# Patient Record
Sex: Male | Born: 1990 | Race: White | Hispanic: No | Marital: Married | Smoking: Never smoker
Health system: Southern US, Community
[De-identification: ages and names within clinical notes are randomized; demographics above are authoritative.]

---
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
## 2011-09-19 ENCOUNTER — Ambulatory Visit (INDEPENDENT_AMBULATORY_CARE_PROVIDER_SITE_OTHER): Payer: PRIVATE HEALTH INSURANCE | Admitting: Family Medicine

## 2011-09-19 VITALS — BP 132/73 | HR 87 | Temp 98.0°F | Resp 16 | Ht 66.38 in | Wt 146.8 lb

## 2011-09-19 DIAGNOSIS — M79609 Pain in unspecified limb: Secondary | ICD-10-CM

## 2011-09-19 DIAGNOSIS — M79676 Pain in unspecified toe(s): Secondary | ICD-10-CM

## 2011-09-19 DIAGNOSIS — L600 Ingrowing nail: Secondary | ICD-10-CM

## 2011-09-19 NOTE — Patient Instructions (Signed)
INGROWN TOENAIL . Keep area clean, dry and bandaged for 24 hours. . After 24 hours, remove outer bandage and leave yellow gauze in place. . Soak toe/foot in warm soapy water for 5-10 minutes, once daily for 5 days. Rebandage toe after each cleaning. . Continue soaks until yellow gauze falls off. . Notify the office if you experience any of the following signs of infection: Swelling, redness, pus drainage, streaking, fever > 101.0 F   

## 2011-09-19 NOTE — Progress Notes (Signed)
VCO.  Digital block with 4 cc 2% lidocaine plain. Sterile Prep and Drape. Medial nail lifted and removed.  Xeroform placed.  Cleansed and dressed.

## 2011-09-19 NOTE — Progress Notes (Signed)
    Patient Name: Tom Tran Date of Birth: January 09, 1991 Medical Record Number: [id number] Gender: male Date of Encounter: 09/19/2011  History of Present Illness:  Tom Tran is a 21 y.o. very pleasant male patient who presents with the following:  Here to evaluate an ingrown left great toenail which has been painful for about 2 days.  It is red and swollen.  He had the same problem (in the same nail this time last year).  He had no injury to the nail. He is generally healthy  There is no problem list on file for this patient.  No past medical history on file. No past surgical history on file. History  Substance Use Topics  . Smoking status: Never Smoker   . Smokeless tobacco: Not on file  . Alcohol Use: Not on file   No family history on file. Allergies  Allergen Reactions  . Cefzil (Cefprozil) Anaphylaxis  . Penicillins Anaphylaxis  . Vantin (Cefpodoxime Proxetil) Anaphylaxis    Medication list has been reviewed and updated.  Prior to Admission medications   Medication Sig Start Date End Date Taking? Authorizing Provider  amphetamine-dextroamphetamine (ADDERALL XR) 30 MG 24 hr capsule Take 30 mg by mouth every morning.   Yes Historical Provider, MD    Review of Systems:  As per HPI- otherwise negative.   Physical Examination: Filed Vitals:   09/19/11 0924  BP: 132/73  Pulse: 87  Temp: 98 F (36.7 C)  Resp: 16   Filed Vitals:   09/19/11 0924  Height: 5' 6.38" (1.686 m)  Weight: 146 lb 12.8 oz (66.588 kg)   Body mass index is 23.42 kg/(m^2). Ideal Body Weight: Weight in (lb) to have BMI = 25: 156.4    GEN: WDWN, NAD, Non-toxic, Alert & Oriented x 3 HEENT: Atraumatic, Normocephalic.  Ears and Nose: No external deformity. EXTR: No clubbing/cyanosis/edema NEURO: Normal gait.  PSYCH: Normally interactive. Conversant. Not depressed or anxious appearing.  Calm demeanor.  Left great toenail- swollen, red and cellulitic at the medial corner of the nail.     Partial nail resection done by Porfirio Oar, PA- C.    Assessment and Plan: 1. Pain in toe   2. Ingrown toenail    Treated as per note by Ms. Leotis Shames.  Wound care gone over, he will follow- up as needed  Cordell Guercio, MD

## 2012-10-04 ENCOUNTER — Ambulatory Visit (INDEPENDENT_AMBULATORY_CARE_PROVIDER_SITE_OTHER): Payer: 59 | Admitting: Physician Assistant

## 2012-10-04 VITALS — BP 134/82 | HR 82 | Temp 98.0°F | Resp 16 | Ht 66.75 in | Wt 146.2 lb

## 2012-10-04 DIAGNOSIS — L600 Ingrowing nail: Secondary | ICD-10-CM

## 2012-10-04 DIAGNOSIS — M79609 Pain in unspecified limb: Secondary | ICD-10-CM

## 2012-10-04 NOTE — Progress Notes (Signed)
  Subjective:    Patient ID: Tom Tran, male    DOB: [birth year], 22 y.o.   MRN: [id number]  HPI   Tom Tran is a very pleasant 22 yr old male here with concern for bilateral ingrown toenails.  States this has been a recurring problem for him, happens almost yearly.  Right is worse than left.  Has tried to take care of it himself without success.  He does not think the left toe nail is ingrown yet, but has just started noticing some swelling.  No drainage from either toe.  No fever or chills.  He otherwise feels well today.     Review of Systems  Skin:       Ingrown toenail  All other systems reviewed and are negative.       Objective:   Physical Exam  Vitals reviewed. Constitutional: He is oriented to person, place, and time. He appears well-developed and well-nourished. No distress.  HENT:  Head: Normocephalic and atraumatic.  Eyes: Conjunctivae are normal. No scleral icterus.  Pulmonary/Chest: Effort normal.  Musculoskeletal:       Feet:  Lateral aspect of right great toe nail ingrown - erythematous and TTP, no drainage, nail is cut very short where pt has tried to dig out himself  Lateral aspect left great toe beginning to ingrow - very slight erythema, swelling; no TTP or drainage; significant callus built up at nail margin  Neurological: He is alert and oriented to person, place, and time.  Skin: Skin is warm and dry.  Psychiatric: He has a normal mood and affect. His behavior is normal.     Procedure:: Verbal consent obtained from the patient.   (1) Gently pared down callused tissue with 15 blade at the lateral aspect of the LEFT great toe nail.  Able to gently lift the corner of the nail up. Placed a steri-strip under corner of the nail, wrapping around the toe to pull tissue away and hopefully prevent the nail from ingrowing. (2) Partial digital block of the RIGHT great toe with 1:1 ratio 2% plain lidocaine with 0.5% marcaine 2cc.  Additional 1cc of local anesthesia to  the nail margin.  Betadine prep.  Sterile technique employed.  Lateral aspect of the great toe nail lifted without difficulty and removed in total.  Proximal aspect of the nail bed explored revealing no nail remnants.  Ingrown tissue debrided and irrigated.  Xeroform dressing applied.  Wound care discussed with patient. Pt tolerated well.      Assessment & Plan:  Ingrown toenail without infection   Tom Tran is a pleasant 22 yr old male with ingrown toenail.  Procedure as documented above.  The area does not appear to be infected, and I do not think he requires antibiotics today.  Discussed RTC precautions such as increased redness, swelling, drainage.  Discussed wound care.  Encouraged pt to cut toenails straight across rather than curved to hopefully prevent ingrowing in the future.

## 2012-10-04 NOTE — Patient Instructions (Addendum)
INGROWN TOENAIL . Keep area clean, dry and bandaged for 24 hours. . After 24 hours, remove outer bandage and leave yellow gauze in place. . Soak toe/foot in warm soapy water for 5-10 minutes, once daily for 5 days. Rebandage toe after each cleaning. . Continue soaks until yellow gauze falls off. . Notify the office if you experience any of the following signs of infection: Swelling, redness, pus drainage, streaking, fever > 101.0 F   

## 2012-10-06 ENCOUNTER — Telehealth: Payer: Self-pay

## 2012-10-06 ENCOUNTER — Encounter: Payer: Self-pay | Admitting: Radiology

## 2012-10-06 ENCOUNTER — Telehealth: Payer: Self-pay | Admitting: Radiology

## 2012-10-06 NOTE — Telephone Encounter (Signed)
Note provided for work for the day he was seen. Called him to advise.

## 2012-10-06 NOTE — Telephone Encounter (Signed)
Pt is calling because he is needing a work note from his visit the other day. His father will be picking up his name is also Tom Tran Call back number is [phone]

## 2012-10-06 NOTE — Telephone Encounter (Signed)
Note provided for 2 days,, then seated work the rest of the week. At front desk for pick up

## 2012-10-06 NOTE — Telephone Encounter (Signed)
Letter provided for patient for his employer.

## 2013-04-05 ENCOUNTER — Ambulatory Visit (INDEPENDENT_AMBULATORY_CARE_PROVIDER_SITE_OTHER): Payer: 59 | Admitting: Internal Medicine

## 2013-04-05 VITALS — BP 144/90 | HR 97 | Temp 98.6°F | Resp 16 | Ht 66.5 in | Wt 147.0 lb

## 2013-04-05 DIAGNOSIS — L03031 Cellulitis of right toe: Principal | ICD-10-CM

## 2013-04-05 DIAGNOSIS — M79609 Pain in unspecified limb: Secondary | ICD-10-CM

## 2013-04-05 DIAGNOSIS — L02619 Cutaneous abscess of unspecified foot: Secondary | ICD-10-CM

## 2013-04-05 DIAGNOSIS — L600 Ingrowing nail: Secondary | ICD-10-CM

## 2013-04-05 DIAGNOSIS — L02611 Cutaneous abscess of right foot: Secondary | ICD-10-CM

## 2013-04-05 DIAGNOSIS — L03039 Cellulitis of unspecified toe: Secondary | ICD-10-CM

## 2013-04-05 MED ORDER — MUPIROCIN 2 % EX OINT: 1.0000 "application " | TOPICAL_OINTMENT | Freq: Three times a day (TID) | CUTANEOUS | Status: AC

## 2013-04-05 MED ORDER — DOXYCYCLINE HYCLATE 100 MG PO TABS: 100.0000 mg | ORAL_TABLET | Freq: Two times a day (BID) | ORAL | Status: AC

## 2013-04-05 NOTE — Progress Notes (Signed)
   Subjective:    Patient ID: Tom Tran, male    DOB: [birth year], 23 y.o.   MRN: [id number]  HPI    Review of Systems     Objective:   Physical Exam        Assessment & Plan:

## 2013-04-05 NOTE — Progress Notes (Signed)
   Subjective:    Patient ID: Tom Tran, male    DOB: November 17, 1990, 23 y.o.   MRN: [id number]  HPI  23 y.o. Male presents to clinic with painful red swollen right great toe. Has previous history of ingrown toe nails and has had procedure done twice before to have toenail removed. Has had purulent discharge and pain x 24 hours . No hx of diabetes, is allergic to penicillin and cephalosporins. Review of Systems healthy    Objective:   Physical Exam  Constitutional: He is oriented to person, place, and time. He appears well-developed and well-nourished. No distress.  HENT:  Head: Normocephalic.  Eyes: EOM are normal.  Pulmonary/Chest: Effort normal.  Musculoskeletal: Normal range of motion.  Neurological: He is alert and oriented to person, place, and time. He exhibits normal muscle tone. Coordination normal.  Skin: Rash noted. Rash is pustular. There is erythema.     Red swollen ingrown nail medial right great toe  Psychiatric: He has a normal mood and affect.    Procedure by Luan PullingElizabeth Eagan      Assessment & Plan:  Cellulitis and ingrown nail right great toe Wound care Doxycycline/Mupirocin and cover

## 2013-04-05 NOTE — Progress Notes (Signed)
Procedure Note: Verbal consent obtained.  Digital block with 3cc 2% plain lidocaine.  Additional 0.5cc local anesthesia.  Betadine prep.  Sterile technique employed.  Lateral edge of right great toe nail lifted without difficulty and removed in total.  Proximal aspect of the nail bed explored revealing no nail remnants.  Ingrown tissue debrided and irrigated.  Xeroform dressing applied.  Wound care discussed with patient.  Patient tolerated very well.

## 2013-04-05 NOTE — Patient Instructions (Addendum)
INGROWN TOENAIL . Keep area clean, dry and bandaged for 24 hours. . After 24 hours, remove outer bandage and leave yellow gauze in place. [place]. Soak toe/foot in warm soapy water for 5-10 minutes, once daily for 5 days. Rebandage toe after each cleaning. . Continue soaks until yellow gauze falls off. . Notify the office if you experience any of the following signs of infection: Swelling, redness, pus drainage, streaking, fever > 101.0 F     Ingrown Toenail An ingrown toenail occurs when the sharp edge of your toenail grows into the skin. Causes of ingrown toenails include toenails clipped too far back or poorly fitting shoes. Activities involving sudden stops (basketball, tennis) causing "toe jamming" may lead to an ingrown nail. HOME CARE INSTRUCTIONS   Soak the whole foot in warm soapy water for 20 minutes, 3 times per day.  You may lift the edge of the nail away from the sore skin by wedging a small piece of cotton under the corner of the nail. Be careful not to dig (traumatize) and cause more injury to the area.  Wear shoes that fit well. While the ingrown nail is causing problems, sandals may be beneficial.  Trim your toenails regularly and carefully. Cut your toenails straight across, not in a curve. This will prevent injury to the skin at the corners of the toenail.  Keep your feet clean and dry.  Crutches may be helpful early in treatment if walking is painful.  Antibiotics, if prescribed, should be taken as directed.  Return for a wound check in 2 days or as directed.  Only take over-the-counter or prescription medicines for pain, discomfort, or fever as directed by your caregiver. SEEK IMMEDIATE MEDICAL CARE IF:   You have a fever.  You have increasing pain, redness, swelling, or heat at the wound site.  Your toe is not better in 7 days. If conservative treatment is not successful, surgical removal of a portion or all of the nail may be necessary. MAKE SURE YOU:    Understand these instructions.  Will watch your condition.  Will get help right away if you are not doing well or get worse. Document Released: 03/08/2000 Document Revised: 06/03/2011 Document Reviewed: 03/02/2008 [facility] Patient Information 2014 [place], [place]. Cellulitis Cellulitis is an infection of the skin and the tissue beneath it. The infected area is usually red and tender. Cellulitis occurs most often in the arms and lower legs.  CAUSES  Cellulitis is caused by bacteria that enter the skin through cracks or cuts in the skin. The most common types of bacteria that cause cellulitis are Staphylococcus and Streptococcus. SYMPTOMS   Redness and warmth.  Swelling.  Tenderness or pain.  Fever. DIAGNOSIS  Your caregiver can usually determine what is wrong based on a physical exam. Blood tests may also be done. TREATMENT  Treatment usually involves taking an antibiotic medicine. HOME CARE INSTRUCTIONS   Take your antibiotics as directed. Finish them even if you start to feel better.  Keep the infected arm or leg elevated to reduce swelling.  Apply a warm cloth to the affected area up to 4 times per day to relieve pain.  Only take over-the-counter or prescription medicines for pain, discomfort, or fever as directed by your caregiver.  Keep all follow-up appointments as directed by your caregiver. SEEK MEDICAL CARE IF:   You notice red streaks coming from the infected area.  Your red area gets larger or turns dark in color.  Your bone or joint underneath the  infected area becomes painful after the skin has healed.  Your infection returns in the same area or another area.  You notice a swollen bump in the infected area.  You develop new symptoms. SEEK IMMEDIATE MEDICAL CARE IF:   You have a fever.  You feel very sleepy.  You develop vomiting or diarrhea.  You have a general ill feeling (malaise) with muscle aches and pains. MAKE SURE YOU:   Understand  these instructions.  Will watch your condition.  Will get help right away if you are not doing well or get worse. Document Released: 12/19/2004 Document Revised: 09/10/2011 Document Reviewed: 05/27/2011 [facility] Patient Information 2014 [place], [place].

## 2013-04-07 LAB — WOUND CULTURE: GRAM STAIN: NONE SEEN

## 2013-06-04 ENCOUNTER — Telehealth: Payer: Self-pay | Admitting: *Deleted

## 2013-06-04 NOTE — Telephone Encounter (Signed)
Pt called requesting a letter or documentation for his insurance company regarding his office visit on 04/05/13. They are denying his claim stating that his ingrown toenail removal & cellulitis culture were cosmetic and not medically necessary.  If one of our providers could review this and write some sort of letter for the patient to pick up it would be greatly appreciated.  Patient's number is [phone]

## 2013-06-06 NOTE — Telephone Encounter (Signed)
Letter written

## 2013-06-06 NOTE — Telephone Encounter (Signed)
Pt seen by Dr. Perrin MalteseGuest and he is off for the next week. Can someone else do this please. Thanks

## 2013-06-06 NOTE — Telephone Encounter (Signed)
LMOM that letter is up front for him to p/u and to CB with any questions.
